# Patient Record
Sex: Female | Born: 1950 | Hispanic: No | Marital: Married | Smoking: Former smoker
Health system: Southern US, Community
[De-identification: ages and names within clinical notes are randomized; demographics above are authoritative.]

## PROBLEM LIST (undated history)

## (undated) DIAGNOSIS — E7800 Pure hypercholesterolemia, unspecified: Secondary | ICD-10-CM

## (undated) DIAGNOSIS — E079 Disorder of thyroid, unspecified: Secondary | ICD-10-CM

## (undated) DIAGNOSIS — I10 Essential (primary) hypertension: Secondary | ICD-10-CM

## (undated) HISTORY — PX: TONSILLECTOMY: SUR1361

## (undated) HISTORY — PX: THYROID SURGERY: SHX805

---

## 2012-02-25 ENCOUNTER — Emergency Department (HOSPITAL_COMMUNITY)
Admission: EM | Admit: 2012-02-25 | Discharge: 2012-02-25 | Disposition: A | Discharge status: Home / Self Care | Payer: BC Managed Care – PPO | Attending: Emergency Medicine | Admitting: Emergency Medicine

## 2012-02-25 ENCOUNTER — Encounter (HOSPITAL_COMMUNITY): Payer: Self-pay | Admitting: *Deleted

## 2012-02-25 ENCOUNTER — Emergency Department (HOSPITAL_COMMUNITY): Payer: BC Managed Care – PPO

## 2012-02-25 DIAGNOSIS — H9319 Tinnitus, unspecified ear: Secondary | ICD-10-CM | POA: Insufficient documentation

## 2012-02-25 DIAGNOSIS — Z79899 Other long term (current) drug therapy: Secondary | ICD-10-CM | POA: Insufficient documentation

## 2012-02-25 DIAGNOSIS — I10 Essential (primary) hypertension: Secondary | ICD-10-CM | POA: Insufficient documentation

## 2012-02-25 DIAGNOSIS — M25559 Pain in unspecified hip: Secondary | ICD-10-CM | POA: Insufficient documentation

## 2012-02-25 DIAGNOSIS — M25519 Pain in unspecified shoulder: Secondary | ICD-10-CM | POA: Insufficient documentation

## 2012-02-25 DIAGNOSIS — M542 Cervicalgia: Secondary | ICD-10-CM | POA: Insufficient documentation

## 2012-02-25 DIAGNOSIS — R51 Headache: Secondary | ICD-10-CM | POA: Insufficient documentation

## 2012-02-25 DIAGNOSIS — Y92410 Unspecified street and highway as the place of occurrence of the external cause: Secondary | ICD-10-CM | POA: Insufficient documentation

## 2012-02-25 HISTORY — DX: Essential (primary) hypertension: I10

## 2012-02-25 MED ORDER — CYCLOBENZAPRINE HCL 10 MG PO TABS: 10.0000 mg | ORAL_TABLET | Freq: Two times a day (BID) | ORAL | Status: AC | PRN

## 2012-02-25 MED ORDER — OXYCODONE-ACETAMINOPHEN 5-325 MG PO TABS: 1.0000 | ORAL_TABLET | ORAL | Status: AC | PRN
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
## 2012-02-26 NOTE — ED Provider Notes (Signed)
Medical screening examination/treatment/procedure(s) were performed by non-physician practitioner and as supervising physician I was immediately available for consultation/collaboration.  Denine Brotz T Alsace Dowd, MD 02/26/12 2328 

## 2013-03-07 ENCOUNTER — Emergency Department (HOSPITAL_COMMUNITY): Payer: BC Managed Care – PPO

## 2013-03-07 ENCOUNTER — Encounter (HOSPITAL_COMMUNITY): Payer: Self-pay | Admitting: Emergency Medicine

## 2013-03-07 ENCOUNTER — Emergency Department (HOSPITAL_COMMUNITY)
Admission: EM | Admit: 2013-03-07 | Discharge: 2013-03-07 | Disposition: A | Discharge status: Home / Self Care | Payer: BC Managed Care – PPO | Attending: Emergency Medicine | Admitting: Emergency Medicine

## 2013-03-07 DIAGNOSIS — I10 Essential (primary) hypertension: Secondary | ICD-10-CM | POA: Insufficient documentation

## 2013-03-07 DIAGNOSIS — R0789 Other chest pain: Secondary | ICD-10-CM

## 2013-03-07 DIAGNOSIS — R0602 Shortness of breath: Secondary | ICD-10-CM | POA: Insufficient documentation

## 2013-03-07 DIAGNOSIS — E7800 Pure hypercholesterolemia, unspecified: Secondary | ICD-10-CM | POA: Insufficient documentation

## 2013-03-07 DIAGNOSIS — E079 Disorder of thyroid, unspecified: Secondary | ICD-10-CM | POA: Insufficient documentation

## 2013-03-07 DIAGNOSIS — Z79899 Other long term (current) drug therapy: Secondary | ICD-10-CM | POA: Insufficient documentation

## 2013-03-07 DIAGNOSIS — Z87891 Personal history of nicotine dependence: Secondary | ICD-10-CM | POA: Insufficient documentation

## 2013-03-07 DIAGNOSIS — R071 Chest pain on breathing: Secondary | ICD-10-CM | POA: Insufficient documentation

## 2013-03-07 HISTORY — DX: Disorder of thyroid, unspecified: E07.9

## 2013-03-07 HISTORY — DX: Pure hypercholesterolemia, unspecified: E78.00

## 2013-03-07 LAB — CBC
Hemoglobin: 13 g/dL (ref 12.0–15.0)
MCH: 30.6 pg (ref 26.0–34.0)
Platelets: 288 10*3/uL (ref 150–400)
RBC: 4.25 MIL/uL (ref 3.87–5.11)

## 2013-03-07 LAB — POCT I-STAT TROPONIN I: Troponin i, poc: 0 ng/mL (ref 0.00–0.08)

## 2013-03-07 LAB — BASIC METABOLIC PANEL
CO2: 29 mEq/L (ref 19–32)
Calcium: 10 mg/dL (ref 8.4–10.5)
GFR calc non Af Amer: >90 mL/min (ref >90)
Glucose, Bld: 77 mg/dL (ref 70–99)
Potassium: 3.9 mEq/L (ref 3.5–5.1)
Sodium: 142 mEq/L (ref 135–145)

## 2013-03-07 LAB — D-DIMER, QUANTITATIVE: D-Dimer, Quant: 0.48 ug/mL-FEU (ref 0.00–0.48)

## 2013-03-07 MED ORDER — METOPROLOL TARTRATE 25 MG PO TABS
100.0000 mg | ORAL_TABLET | Freq: Once | ORAL | Status: AC
  Administered 2013-03-07: 100 mg via ORAL
  Filled 2013-03-07: qty 4

## 2013-03-07 MED ORDER — ASPIRIN 325 MG PO TABS
325.0000 mg | ORAL_TABLET | Freq: Once | ORAL | Status: AC
  Administered 2013-03-07: 325 mg via ORAL
  Filled 2013-03-07: qty 1

## 2013-03-07 MED ORDER — KETOROLAC TROMETHAMINE 30 MG/ML IJ SOLN
30.0000 mg | Freq: Once | INTRAMUSCULAR | Status: AC
  Administered 2013-03-07: 30 mg via INTRAVENOUS
  Filled 2013-03-07: qty 1

## 2013-03-07 MED ORDER — METOPROLOL TARTRATE 1 MG/ML IV SOLN
INTRAVENOUS | Status: AC
  Filled 2013-03-07: qty 15

## 2013-03-07 MED ORDER — LABETALOL HCL 5 MG/ML IV SOLN
5.0000 mg | Freq: Once | INTRAVENOUS | Status: AC
  Administered 2013-03-07: 5 mg via INTRAVENOUS
  Filled 2013-03-07: qty 4

## 2013-03-07 MED ORDER — IOHEXOL 350 MG/ML SOLN
80.0000 mL | Freq: Once | INTRAVENOUS | Status: AC | PRN
  Administered 2013-03-07: 80 mL via INTRAVENOUS

## 2013-03-07 NOTE — ED Notes (Signed)
Patient going to CT #3 with RN and transportation.

## 2013-03-07 NOTE — ED Provider Notes (Addendum)
62 year old female, history of hypertension, hypercholesterolemia who presents with sharp left-sided chest pain, worse with deep breathing, pinpointed to the left inframammary area. Non-reproducible on palpation, no swelling of the legs, no other risk factors for heart disease or risk factors for pulmonary embolus. No history of stress test.  EKG unremarkable  On my exam clear heart and lung sounds, no murmurs rubs or gallops, no abnormal lung sounds, no distress. Vital signs normal, pulse 70, blood pressure 135/85.  Will  Perform CT coronary to rule out obstruction though less likely.   I have seen and interpreted the EKG and agree with the resident interpretation.   I saw and evaluated the patient, reviewed the resident's note and I agree with the findings and plan.   Vida Roller, MD 03/07/13 1036  Vida Roller, MD 03/07/13 [phone]

## 2013-03-07 NOTE — ED Notes (Signed)
Patient returned to room from CT. 

## 2013-03-07 NOTE — ED Provider Notes (Signed)
History     CSN: [id number]  Arrival date & time 03/07/13  9147   First MD Initiated Contact with Patient 03/07/13 [phone]      Chief Complaint  Patient presents with  . Chest Pain    left    (Consider location/radiation/quality/duration/timing/severity/associated sxs/prior treatment) Patient is a 62 y.o. female presenting with chest pain. The history is provided by the patient and medical records.  Chest Pain Pain location:  L chest Pain quality: sharp and stabbing   Pain radiates to:  Does not radiate Pain radiates to the back: no   Pain severity:  Mild Onset quality:  Sudden Duration:  4 hours Timing:  Constant Progression:  Unchanged Chronicity:  New Context: breathing   Relieved by: pushing on the exact spot on the left chest. Worsened by:  Deep breathing Associated symptoms: shortness of breath   Associated symptoms: no abdominal pain, no cough, no diaphoresis, no fever, no palpitations and not vomiting   Risk factors: high cholesterol and hypertension   Risk factors: no diabetes mellitus, no immobilization, not pregnant, no prior DVT/PE, no smoking and no surgery     Past Medical History  Diagnosis Date  . Hypertension   . Hypercholesterolemia   . Thyroid disease     Past Surgical History  Procedure Laterality Date  . Thyroid surgery      No family history on file.  History  Substance Use Topics  . Smoking status: Former Games developer  . Smokeless tobacco: Not on file  . Alcohol Use: No    OB History   Grav Para Term Preterm Abortions TAB SAB Ect Mult Living                  Review of Systems  Constitutional: Negative for fever, chills, diaphoresis, activity change and appetite change.  HENT: Negative for neck pain and neck stiffness.   Respiratory: Positive for shortness of breath. Negative for cough, chest tightness and wheezing.   Cardiovascular: Positive for chest pain. Negative for palpitations.  Gastrointestinal: Negative for vomiting, abdominal  pain, diarrhea and constipation.  Genitourinary: Negative for dysuria, decreased urine volume and difficulty urinating.  Skin: Negative for rash and wound.  Neurological: Negative for syncope and light-headedness.  Psychiatric/Behavioral: Negative for confusion and agitation.  All other systems reviewed and are negative.    Allergies  Peanut-containing drug products  Home Medications   Current Outpatient Rx  Name  Route  Sig  Dispense  Refill  . ALPRAZolam (XANAX) 0.25 MG tablet   Oral   Take 0.25 mg by mouth 2 (two) times daily as needed. For axienty         . cloNIDine (CATAPRES) 0.1 MG tablet   Oral   Take 0.1 mg by mouth 2 (two) times daily.         Marland Kitchen levothyroxine (SYNTHROID, LEVOTHROID) 88 MCG tablet   Oral   Take 88 mcg by mouth daily.         . rosuvastatin (CRESTOR) 5 MG tablet   Oral   Take 5 mg by mouth daily.           BP 144/92  Pulse 85  Resp 16  SpO2 98%  Physical Exam  Nursing note and vitals reviewed. Constitutional: She is oriented to person, place, and time. She appears well-developed and well-nourished.  HENT:  Head: Normocephalic and atraumatic.  Right Ear: External ear normal.  Left Ear: External ear normal.  Nose: Nose normal.  Mouth/Throat: Oropharynx is clear and  moist. No oropharyngeal exudate.  Eyes: Conjunctivae are normal. Pupils are equal, round, and reactive to light.  Neck: Normal range of motion. Neck supple.  Cardiovascular: Normal rate, regular rhythm, normal heart sounds and intact distal pulses.  Exam reveals no gallop and no friction rub.   No murmur heard. Pulmonary/Chest: Effort normal and breath sounds normal. No respiratory distress. She has no wheezes. She has no rales. She exhibits no tenderness.  Abdominal: Soft. Bowel sounds are normal. She exhibits no distension and no mass. There is no tenderness. There is no rebound and no guarding.  Musculoskeletal: Normal range of motion. She exhibits no edema and no  tenderness.  Neurological: She is alert and oriented to person, place, and time.  Skin: Skin is warm and dry.  Psychiatric: She has a normal mood and affect. Her behavior is normal. Judgment and thought content normal.    ED Course  Procedures (including critical care time)  Labs Reviewed  CBC  BASIC METABOLIC PANEL  D-DIMER, QUANTITATIVE  POCT I-STAT TROPONIN I  POCT I-STAT TROPONIN I   Dg Chest 2 View  03/07/2013  *RADIOLOGY REPORT*  Clinical Data: Chest pain  CHEST - 2 VIEW  Comparison: 02/25/2012  Findings: Cardiomediastinal silhouette is stable.  Mild hyperinflation again noted.  No acute infiltrate or pulmonary edema.  Stable probable chronic mild interstitial prominence.  IMPRESSION: No active disease.  Mild hyperinflation.  No significant change.   Original Report Authenticated By: Natasha Mead, M.D.    Ct Heart Morp W/cta Cor W/score W/ca W/cm &/or Wo/cm  03/07/2013  *RADIOLOGY REPORT*  INDICATION: Chest pain  CT ANGIOGRAPHY OF THE HEART, CORONARY ARTERY, STRUCTURE, AND MORPHOLOGY  COMPARISON:  None  CONTRAST: 80mL OMNIPAQUE IOHEXOL 350 MG/ML SOLN  TECHNIQUE:  CT angiography of the coronary vessels was performed on a 256 channel system using prospective ECG gating.  A scout and ECG- gated noncontrast exam (for calcium scoring) were performed. Appropriate delay was determined by bolus tracking after injection of iodinated contrast, and an ECG-gated coronary CTA was performed with sub-mm slice collimation during late diastole.  Imaging post processing was performed on an independent workstation creating multiplanar and 3-D images, allowing for quantitative analysis of the heart and coronary arteries.  Note that this exam targets the heart and the chest was not imaged in its entirety.  PREMEDICATION: Lopressor  100 mg, P.O.  Nitroglycerin  400 mcg, sublingual.  5 mg IV labatelol  FINDINGS: Technical quality: good Heart rate:  57.  CORONARY ARTERIES: Left Main:  Normal sized vessel with no   luminal stenosis. There is a calcific lesion just superior to the origin of the left main coronary artery.  LAD:  Normal sized vessel with no atherosclerotic disease or luminal stenosis. Diagonals:  Single diagonal without atherosclerotic disease. LCx:  Normal sized vessel with no atherosclerotic disease or luminal stenosis. OMs:  A single obtuse marginal without atherosclerotic disease. RCA:   Normal sized vessel with no atherosclerotic disease or luminal stenosis. PDA:        Normal sized vessel with no atherosclerotic disease or luminal stenosis. Dominance: right  CORONARY CALCIUM: Total Agatston Score:         76.3 MESA database percentile:     90th  AORTA AND PULMONARY MEASUREMENTS:  OTHER FINDINGS: Mild atelectasis at the lung bases.  Mild calcifications along the root of the aorta.  No pericardial fluid.  Limited view upper is unremarkable.  Review of the skeleton is unremarkable.  IMPRESSION:  1.  No  evidence of luminal stenosis within the coronary arteries. 2.  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 3.  Total calcium score equal 76 which is 90% for the age-matched database.  Of note entirety of measured calcification is at the ostium of the left main artery. Recommend outpatient cardiology consultation.  Report was called to Dr. Preston Fleeting at 15 45 on 03/07/2013.   Original Report Authenticated By: Genevive Bi, M.D.      Date: 03/07/2013  Rate: 82 bpm  Rhythm: normal sinus rhythm  QRS Axis: left  Intervals: normal  ST/T Wave abnormalities: nonspecific ST changes  Conduction Disutrbances:none  Narrative Interpretation: Normal sinus rhythm without evidence of acute ischemia  Old EKG Reviewed: none available    1. Acute chest wall pain       MDM  62 yo F presents for several hrs of left-sided pleuritic chest pain without radiation or associated symptoms. No recent illnesses. Afebrile, vital signs stable. Pain improved with palpation of spot on left chest. EKG  without evidence of acute ischemia. Low risk Wells Criteria; PERC positive (age). D-dimer negative; doubt PE in this low-risk patient. Serial troponins negative. Cardiac CT obtained due to pt's family history and no history of provocative testing. Imaging reveals no obstructive disease but calcium score is 90th percentile for age. Will have pt follow-up with Cardiology. Patient given return precautions, including worsening of signs or symptoms. Patient instructed to follow-up with Cardiology regarding plaque.         Clemetine Marker, MD 03/07/13 [phone]

## 2013-03-07 NOTE — ED Notes (Signed)
Lab at bedside

## 2013-03-07 NOTE — ED Notes (Signed)
Per EMS, patient started having L rib pain that worsened with inhalation, movement and palpation.   No radiation. Denies trauma/injury.  Denies cough.  18 L hand PTA by EMS.

## 2013-03-08 NOTE — ED Provider Notes (Signed)
I saw and evaluated the patient, reviewed the resident's note and I agree with the findings and plan.  I have seen and interpreted the EKG and I agree with the resident's interpretation.  Please see my separate note for details regarding my interaction with the patient.  Vida Roller, MD 03/08/13 [phone]

## 2013-04-05 ENCOUNTER — Ambulatory Visit (INDEPENDENT_AMBULATORY_CARE_PROVIDER_SITE_OTHER): Payer: BC Managed Care – PPO | Admitting: Internal Medicine

## 2013-04-05 ENCOUNTER — Encounter (HOSPITAL_COMMUNITY): Payer: Self-pay | Admitting: Internal Medicine

## 2013-04-05 ENCOUNTER — Encounter: Payer: Self-pay | Admitting: Internal Medicine

## 2013-04-05 VITALS — BP 140/96 | HR 77 | Ht 66.0 in | Wt 139.8 lb

## 2013-04-05 DIAGNOSIS — R079 Chest pain, unspecified: Secondary | ICD-10-CM | POA: Insufficient documentation

## 2013-04-05 DIAGNOSIS — I10 Essential (primary) hypertension: Secondary | ICD-10-CM | POA: Insufficient documentation

## 2013-04-05 DIAGNOSIS — R9389 Abnormal findings on diagnostic imaging of other specified body structures: Secondary | ICD-10-CM

## 2013-04-05 DIAGNOSIS — R931 Abnormal findings on diagnostic imaging of heart and coronary circulation: Secondary | ICD-10-CM | POA: Insufficient documentation

## 2013-04-05 DIAGNOSIS — E785 Hyperlipidemia, unspecified: Secondary | ICD-10-CM

## 2013-04-05 NOTE — Progress Notes (Signed)
OFFICE NOTE  Chief Complaint:  Chest pain  Primary Care Physician: Windy Carina, PA-C  HPI:  Jade Davidson is a pleasant 62 year old female originally from [place], with advanced degrees, and a history of dyslipidemia, hypertension, 30-pack-year smoking history, and family history of coronary disease. Currently works as a Psychiatrist.  Recently she was at school and was sitting at her desk, when she developed acute onset sharp, stabbing left chest pain which was worse with inspiration. This lasted for several minutes and was so intense that EMS was called and she was transported to the emergency room. Apparently she was administered nitroglycerin by EMS and had resolution of her pain, but it was unclear whether this is related to medication or time. She underwent testing in the emergency room including a CT angiogram. This demonstrated nonobstructive coronary disease, but there was a chunk of calcium at the left main ostium/possibly the coronary cusp. Her calcium score was 73, which is the 90th percentile given her age. She's had no further chest pain symptoms. She was referred for evaluation of her abnormal cardiac CT and chest pain episode.  PMHx:  Past Medical History  Diagnosis Date  . Hypertension   . Hypercholesterolemia   . Thyroid disease     Past Surgical History  Procedure Laterality Date  . Thyroid surgery    . Tonsillectomy  age 45    FAMHx:  Family History  Problem Relation Age of Onset  . Heart disease Mother   . Stroke Father   . Hypertension Father   . Kidney disease Mother   . Stroke Paternal Grandmother     SOCHx:   reports that she quit smoking about 12 months ago. She does not have any smokeless tobacco history on file. She reports that she does not drink alcohol or use illicit drugs.  ALLERGIES:  Allergies  Allergen Reactions  . Peanut-Containing Drug Products     Swelling, itching    ROS: A comprehensive review of systems was negative  except for: Cardiovascular: positive for chest pain  HOME MEDS: Current Outpatient Prescriptions  Medication Sig Dispense Refill  . ALPRAZolam (XANAX) 0.25 MG tablet Take 0.5 mg by mouth 2 (two) times daily as needed for sleep. For axienty      . levothyroxine (SYNTHROID, LEVOTHROID) 75 MCG tablet Take 75 mcg by mouth daily before breakfast.      . metoprolol succinate (TOPROL-XL) 25 MG 24 hr tablet Take 25 mg by mouth daily.      . pravastatin (PRAVACHOL) 20 MG tablet Take 20 mg by mouth daily.       No current facility-administered medications for this visit.    LABS/IMAGING: No results found for this or any previous visit (from the past 48 hour(s)). No results found.  VITALS: BP 140/96  Pulse 77  Ht 5\' 6"  (1.676 m)  Wt 139 lb 12.8 oz (63.413 kg)  BMI 22.58 kg/m2  EXAM: General appearance: alert and no distress Neck: no adenopathy, no carotid bruit, no JVD, supple, symmetrical, trachea midline and thyroid not enlarged, symmetric, no tenderness/mass/nodules Lungs: clear to auscultation bilaterally Heart: regular rate and rhythm, S1, S2 normal, no murmur, click, rub or gallop Abdomen: soft, non-tender; bowel sounds normal; no masses,  no organomegaly Extremities: extremities normal, atraumatic, no cyanosis or edema Pulses: 2+ and symmetric Skin: Skin color, texture, turgor normal. No rashes or lesions Neurologic: Grossly normal  EKG: Normal sinus rhythm at 77, possible left atrial enlargement, incomplete right bundle pattern, poor R-wave progression  ASSESSMENT: 1. Chest pain, with typical and atypical features 2. Abnormal cardiac CT with a low calcium score, however calcium is possibly located at the ostium of the left main or perhaps the left coronary cusp 3. Hypertension 4. Dyslipidemia 5. Former 30-pack-year smoker 6. Family history of coronary disease  PLAN: 1.   Jade Davidson had an episode of intense chest pain, but did not have any evidence of myocardial damage.  Her CT demonstrated calcium at/or around the left main coronary ostium. It is difficult to tell from the images whether this is in the vessel, in the adventitia, or in the coronary sinus. Overall, her calcium score is low, but for her age presents greater risk.  Certainly, she has multiple risk factors for coronary disease. What remains to be seen as whether there is hemodynamically significant stenosis of the left main.  She certainly does not describe symptoms such as worsening fatigue, shortness of breath with exertion, or chest pain which is predictable, to suggest that. I would recommend exercise nuclear stress testing to determine the physiologic significance of her coronary calcium.  That is negative, would recommend continued aggressive medical therapy and lifestyle modification to reduce her risk of further coronary disease.  Thank you for referring her for consult.  Chrystie Nose, MD, [facility] Attending Cardiologist [facility]  Rotha Cassels C 04/05/2013, 6:03 PM

## 2013-04-05 NOTE — Patient Instructions (Addendum)
Your physician has requested that you have en exercise stress myoview. For further information please visit [email or web address] Please follow instruction sheet, as given. Please Schedule test after June 16th  Your physician recommends that you schedule a follow-up appointment in: after stress test

## 2013-05-03 ENCOUNTER — Ambulatory Visit (HOSPITAL_COMMUNITY)
Admission: RE | Admit: 2013-05-03 | Discharge: 2013-05-03 | Disposition: A | Discharge status: Home / Self Care | Payer: BC Managed Care – PPO | Source: Ambulatory Visit | Attending: Cardiovascular Disease | Admitting: Cardiovascular Disease

## 2013-05-03 DIAGNOSIS — R079 Chest pain, unspecified: Secondary | ICD-10-CM | POA: Insufficient documentation

## 2013-05-03 DIAGNOSIS — R5381 Other malaise: Secondary | ICD-10-CM | POA: Insufficient documentation

## 2013-05-03 DIAGNOSIS — R0609 Other forms of dyspnea: Secondary | ICD-10-CM | POA: Insufficient documentation

## 2013-05-03 DIAGNOSIS — R0989 Other specified symptoms and signs involving the circulatory and respiratory systems: Secondary | ICD-10-CM | POA: Insufficient documentation

## 2013-05-03 DIAGNOSIS — R002 Palpitations: Secondary | ICD-10-CM | POA: Insufficient documentation

## 2013-05-03 DIAGNOSIS — R931 Abnormal findings on diagnostic imaging of heart and coronary circulation: Secondary | ICD-10-CM

## 2013-05-03 DIAGNOSIS — R42 Dizziness and giddiness: Secondary | ICD-10-CM | POA: Insufficient documentation

## 2013-05-03 MED ORDER — TECHNETIUM TC 99M SESTAMIBI GENERIC - CARDIOLITE
10.9000 | Freq: Once | INTRAVENOUS | Status: AC | PRN
  Administered 2013-05-03: 10.9 via INTRAVENOUS

## 2013-05-03 MED ORDER — TECHNETIUM TC 99M SESTAMIBI GENERIC - CARDIOLITE
31.7000 | Freq: Once | INTRAVENOUS | Status: AC | PRN
  Administered 2013-05-03: 31.7 via INTRAVENOUS

## 2013-05-03 NOTE — Procedures (Addendum)
[facility] [address] [place] [address] [place] [address] [phone]  Cardiology Nuclear Med Study  Jade Davidson is a 62 y.o. female     MRN : [id number]     DOB: January 07, 1951  Procedure Date: 05/03/2013  Nuclear Med Background Indication for Stress Test:  Evaluation for Ischemia and Abnormal EKG History:  cad Cardiac Risk Factors: Family History - CAD, History of Smoking, Hypertension and Lipids  Symptoms:  Chest Pain, Dizziness, DOE, Fatigue, Light-Headedness and Palpitations   Nuclear Pre-Procedure Caffeine/Decaff Intake:  7:00pm NPO After: 5:00am   IV Site: R Forearm  IV 0.9% NS with Angio Cath:  22g  Chest Size (in):  N/A IV Started by: Emmit Pomfret, RN  Height: 5\' 6"  (1.676 m)  Cup Size: B  BMI:  Body mass index is 22.45 kg/(m^2). Weight:  139 lb (63.05 kg)   Tech Comments:  N/A    Nuclear Med Study 1 or 2 day study: 1 day  Stress Test Type:  Stress  Order Authorizing Provider:  Zoila Shutter, MD   Resting Radionuclide: Technetium 3m Sestamibi  Resting Radionuclide Dose: 10.9 mCi   Stress Radionuclide:  Technetium 29m Sestamibi  Stress Radionuclide Dose: 31.7 mCi           Stress Protocol Rest HR: 81 Stress HR: 137  Rest BP: 145/106 Stress BP: 180/103  Exercise Time (min): 6:30 METS: 7.0   Predicted Max HR: 161 bpm % Max HR: 85.09 bpm Rate Pressure Product: 21308  Dose of Adenosine (mg):  n/a Dose of Lexiscan: n/a mg  Dose of Atropine (mg): n/a Dose of Dobutamine: n/a mcg/kg/min (at max HR)  Stress Test Technologist: Esperanza Sheets, CCT Nuclear Technologist: Gonzella Lex, CNMT   Rest Procedure:  Myocardial perfusion imaging was performed at rest 45 minutes following the intravenous administration of Technetium 77m Sestamibi. Stress Procedure:  The patient performed treadmill exercise using a Bruce  Protocol for 6:30 minutes. The patient stopped due to SOB and fatigue and denied any chest pain.  There were no  significant ST-T wave changes.  Technetium 13m Sestamibi was injected at peak exercise and myocardial perfusion imaging was performed after a brief delay.  Transient Ischemic Dilatation (Normal <1.22):  0.83 Lung/Heart Ratio (Normal <0.45):  0.25 QGS EDV:  59 ml QGS ESV:  15 ml LV Ejection Fraction: 75%  Signed by       Rest ECG: NSR - Normal EKG  Stress ECG: No significant change from baseline ECG  QPS Raw Data Images:  Normal; no motion artifact; normal heart/lung ratio. Stress Images:  Normal homogeneous uptake in all areas of the myocardium. Rest Images:  Normal homogeneous uptake in all areas of the myocardium. Subtraction (SDS):  No evidence of ischemia.  Impression Exercise Capacity:  Good exercise capacity. BP Response:  Normal blood pressure response. Clinical Symptoms:  No significant symptoms noted. ECG Impression:  No significant ST segment change suggestive of ischemia. Comparison with Prior Nuclear Study: No images to compare  Overall Impression:  Normal stress nuclear study.  LV Wall Motion:  NL LV Function; NL Wall Motion   Runell Gess, MD  05/03/2013 5:59 PM

## 2013-05-06 ENCOUNTER — Telehealth: Payer: Self-pay | Admitting: *Deleted

## 2013-05-06 NOTE — Telephone Encounter (Signed)
Message copied by Chauncey Reading on Mon May 06, 2013  8:11 AM ------      Message from: Chrystie Nose      Created: Sat May 04, 2013  7:55 AM       Please notify patient that the test results were normal.            -Dr. Rennis Golden ------

## 2013-05-06 NOTE — Telephone Encounter (Signed)
Result letter drafted and mailed to pt.  

## 2013-05-07 ENCOUNTER — Encounter (HOSPITAL_COMMUNITY): Payer: BC Managed Care – PPO

## 2013-05-21 ENCOUNTER — Ambulatory Visit: Payer: BC Managed Care – PPO | Admitting: Internal Medicine

## 2013-09-03 ENCOUNTER — Other Ambulatory Visit: Payer: Self-pay | Admitting: Family Medicine

## 2013-09-03 ENCOUNTER — Ambulatory Visit
Admission: RE | Admit: 2013-09-03 | Discharge: 2013-09-03 | Disposition: A | Discharge status: Home / Self Care | Payer: Worker's Compensation | Source: Ambulatory Visit | Attending: Family Medicine | Admitting: Family Medicine

## 2013-09-03 DIAGNOSIS — R609 Edema, unspecified: Secondary | ICD-10-CM

## 2017-06-22 ENCOUNTER — Other Ambulatory Visit (HOSPITAL_COMMUNITY): Payer: Self-pay | Admitting: Oculoplastics Ophthalmology

## 2017-06-22 DIAGNOSIS — E0500 Thyrotoxicosis with diffuse goiter without thyrotoxic crisis or storm: Secondary | ICD-10-CM

## 2017-06-28 ENCOUNTER — Ambulatory Visit (HOSPITAL_COMMUNITY): Payer: BC Managed Care – PPO

## 2017-06-28 ENCOUNTER — Ambulatory Visit (HOSPITAL_COMMUNITY)
Admission: RE | Admit: 2017-06-28 | Discharge: 2017-06-28 | Disposition: A | Discharge status: Home / Self Care | Payer: BC Managed Care – PPO | Source: Ambulatory Visit | Attending: Oculoplastics Ophthalmology | Admitting: Oculoplastics Ophthalmology

## 2017-06-28 DIAGNOSIS — H0520 Unspecified exophthalmos: Secondary | ICD-10-CM | POA: Diagnosis not present

## 2017-06-28 DIAGNOSIS — E0500 Thyrotoxicosis with diffuse goiter without thyrotoxic crisis or storm: Secondary | ICD-10-CM | POA: Insufficient documentation

## 2018-06-07 IMAGING — CT CT ORBITS W/O CM
3 series · 14 of 47 positions shown, 16 images · non-contrast
Comparison: None or for

CLINICAL DATA: Proptosis.  Possible thyroid disorder.

EXAM:
CT ORBITS WITHOUT CONTRAST
TECHNIQUE: Multidetector CT images were obtained using the standard protocol
without intravenous contrast.

[Series 3: facialbone 2.0 st · axial · 0.37mm/px · z∈[-130,-30]mm · 8 of 60 slices shown, 10 images]
[im 5/60  brain]
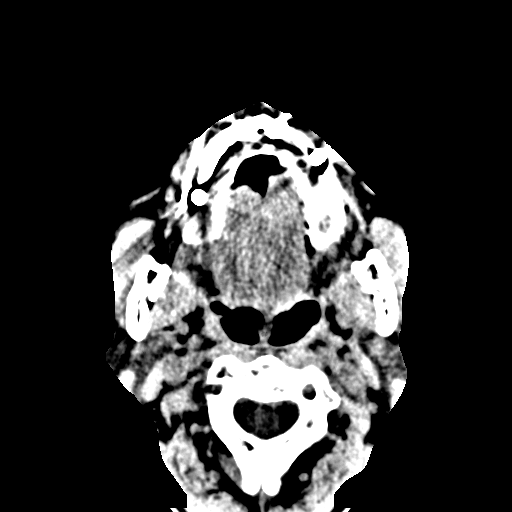
[im 5/60  bone]
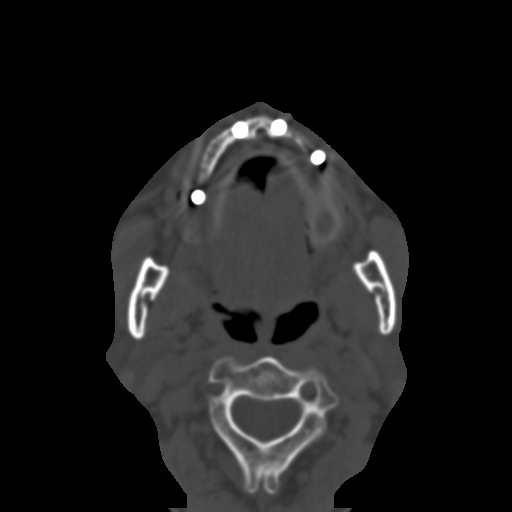
[im 13/60  bone]
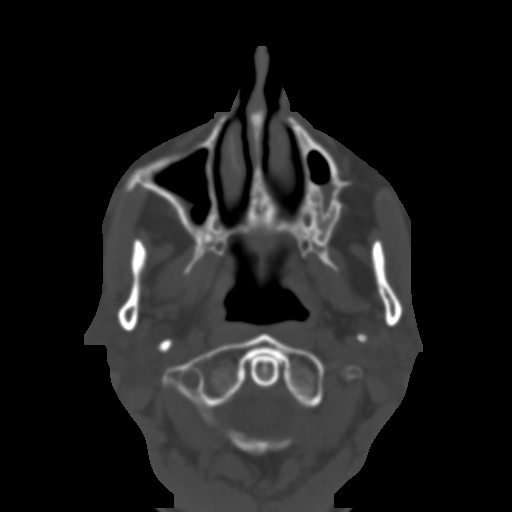
[im 19/60  bone]
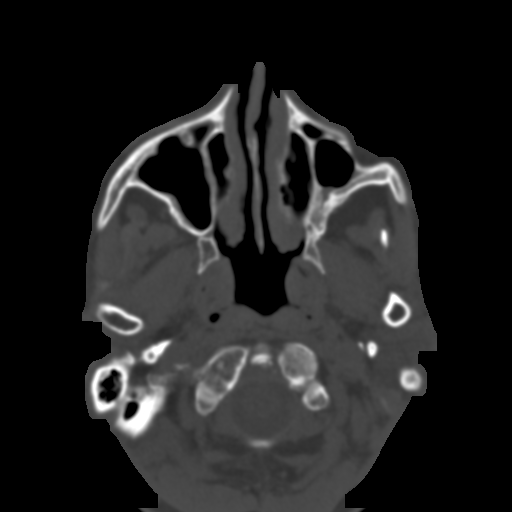
[im 27/60  bone]
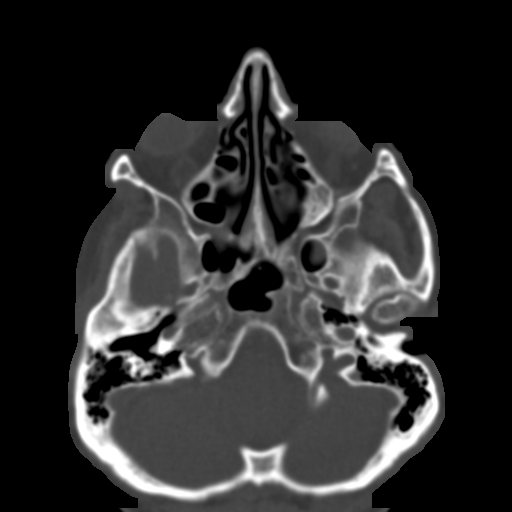
[im 33/60  brain]
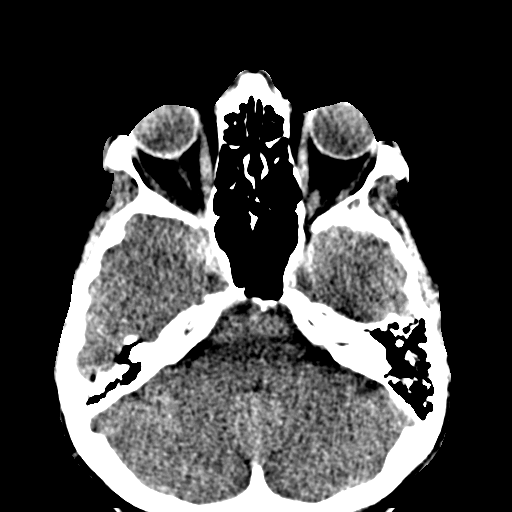
[im 33/60  bone]
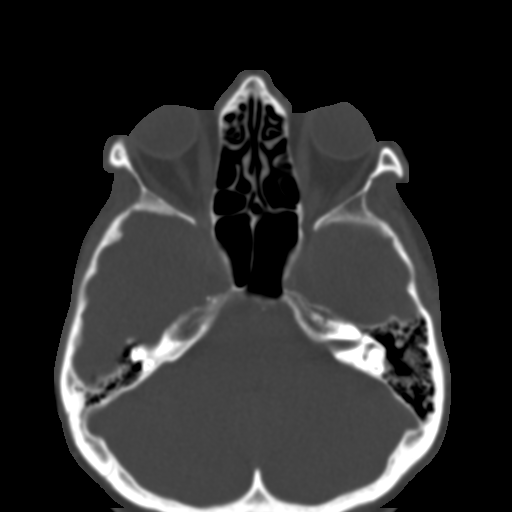
[im 41/60  bone]
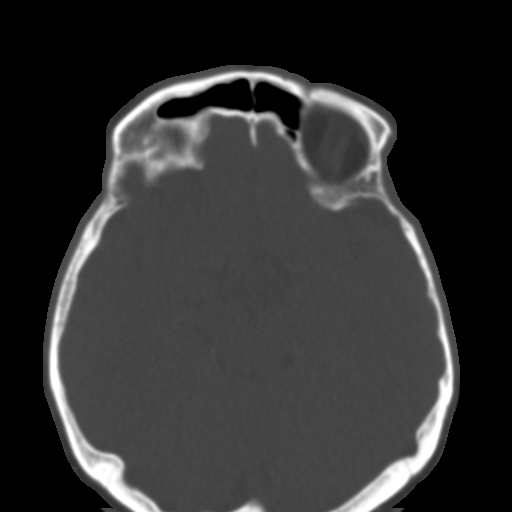
[im 47/60  bone]
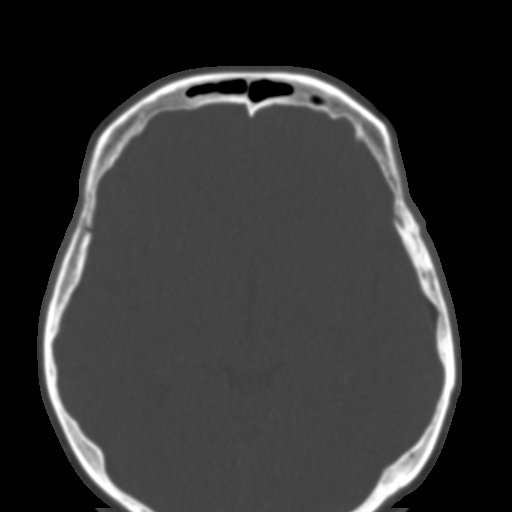
[im 55/60  bone]
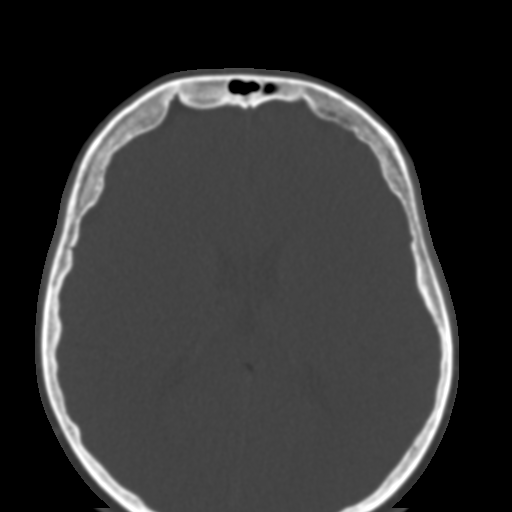

[Series 7: facialbone 2.0 cor st · coronal · 0.23mm/px · 3 of 91 slices shown]
[im 31/91  bone]
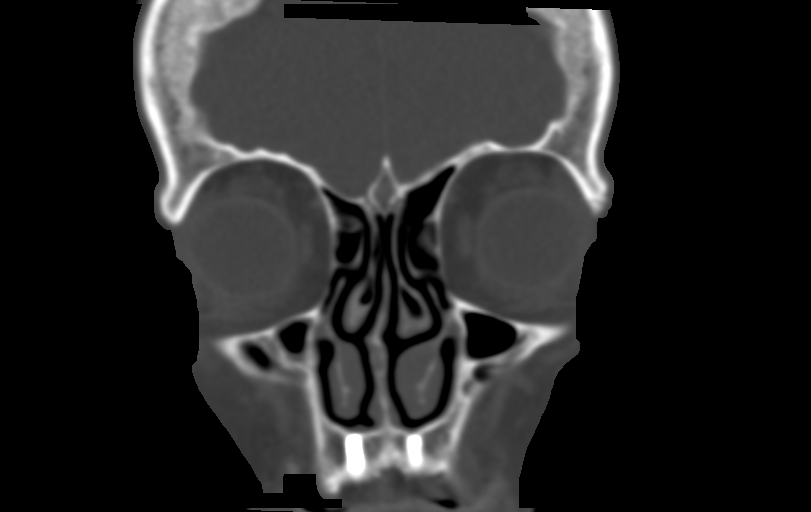
[im 41/91  bone]
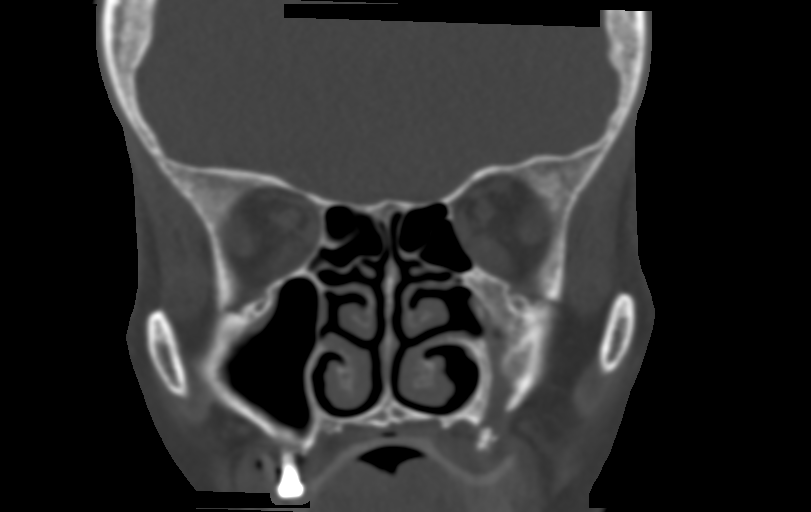
[im 51/91  bone]
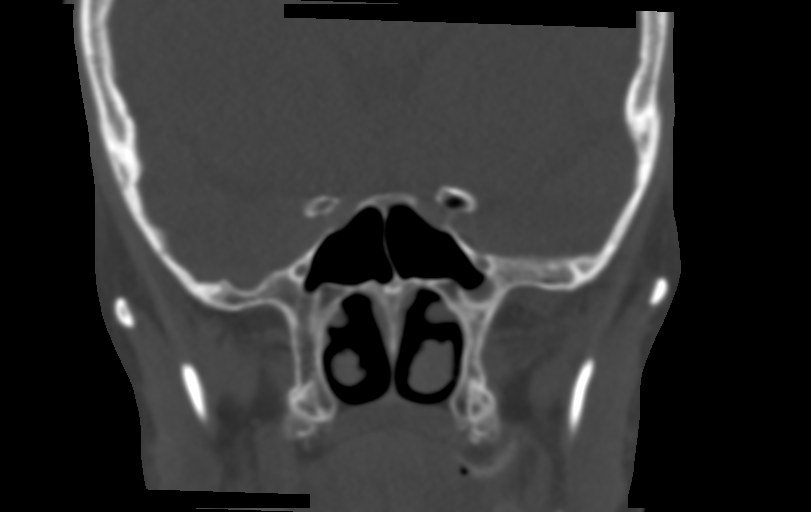

[Series 8: facialbone 2.0 sag st · sagittal · 0.23mm/px · 3 of 94 slices shown]
[im 32/94  bone]
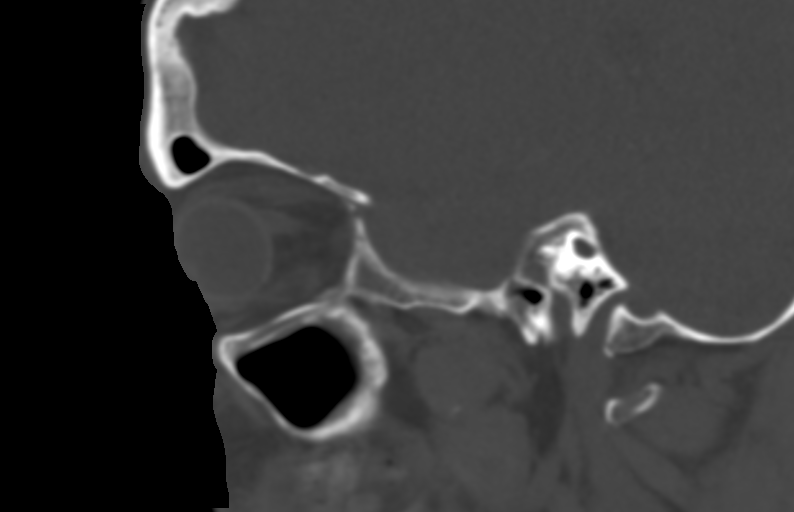
[im 47/94  bone]
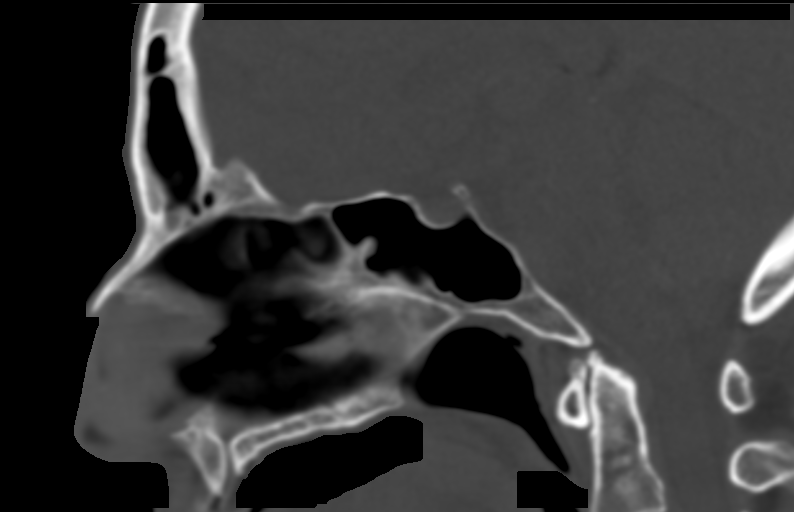
[im 63/94  bone]
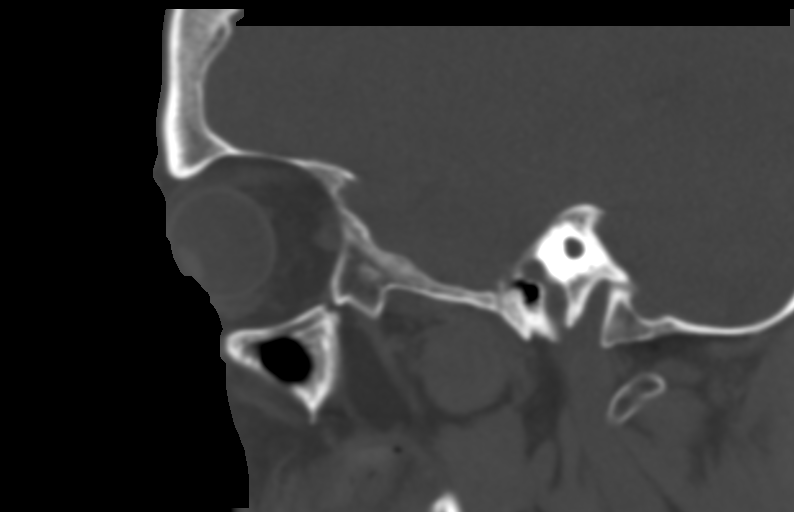

[14 of 47 positions shown; findings below may reference images not displayed]

FINDINGS: Orbits: No traumatic or inflammatory finding. Globes, optic nerves,
vascular structures, and lacrimal glands are normal. BILATERAL
proptosis is seen. There is mild symmetric increase in BILATERAL
orbital fat, without stranding, as well as mild enlargement of the
BILATERAL medial and inferior recti, all consistent with thyroid
ophthalmopathy.

Visualized sinuses: Chronic sinus disease most notable in the
maxillary regions. Thickening of the bone with decreased volume of
the LEFT maxillary sinus. Some slight layering fluid on the LEFT.

Soft tissues: Negative.

Limited intracranial: No abnormality.
IMPRESSION: BILATERAL proptosis appears related to increased orbital fat and
enlarged inferior and medial recti consistent with the diagnosis of
thyroid ophthalmopathy. No orbital mass or inflammatory process.

## 2018-09-18 ENCOUNTER — Telehealth: Payer: Self-pay

## 2018-09-18 NOTE — Telephone Encounter (Signed)
SENT REFERRAL TO SCHEDULING AND FILED NOTES 

## 2018-12-12 ENCOUNTER — Encounter: Payer: Self-pay | Admitting: Internal Medicine

## 2018-12-12 ENCOUNTER — Encounter (INDEPENDENT_AMBULATORY_CARE_PROVIDER_SITE_OTHER): Payer: Self-pay

## 2018-12-12 ENCOUNTER — Ambulatory Visit: Payer: BC Managed Care – PPO | Admitting: Internal Medicine

## 2018-12-12 VITALS — BP 119/80 | HR 75 | Ht 66.0 in | Wt 144.0 lb

## 2018-12-12 DIAGNOSIS — R0789 Other chest pain: Secondary | ICD-10-CM

## 2018-12-12 DIAGNOSIS — I10 Essential (primary) hypertension: Secondary | ICD-10-CM | POA: Diagnosis not present

## 2018-12-12 DIAGNOSIS — E785 Hyperlipidemia, unspecified: Secondary | ICD-10-CM | POA: Diagnosis not present

## 2018-12-12 MED ORDER — ROSUVASTATIN CALCIUM 20 MG PO TABS: 20.0000 mg | ORAL_TABLET | Freq: Every day | ORAL | 3 refills | Status: AC

## 2018-12-12 NOTE — Patient Instructions (Signed)
Medication Instructions:  Your physician has recommended you make the following change in your medication:   START Crestor 20mg  daily. An Rx has been sent to your pharmacy  If you need a refill on your cardiac medications before your next appointment, please call your pharmacy.   Lab work: Your physician recommends that you return for a FASTING lipid profile in 3 months  If you have labs (blood work) drawn today and your tests are completely normal, you will receive your results only by: Marland Kitchen MyChart Message (if you have MyChart) OR . A paper copy in the mail If you have any lab test that is abnormal or we need to change your treatment, we will call you to review the results.  Testing/Procedures: None ordered  Follow-Up: At [facility], you and your health needs are our priority.  As part of our continuing mission to provide you with exceptional heart care, we have created designated Provider Care Teams.  These Care Teams include your primary Cardiologist (physician) and Advanced Practice Providers (APPs -  Physician Assistants and Nurse Practitioners) who all work together to provide you with the care you need, when you need it. You will need a follow up appointment in 3 months.  You may see No primary care provider on file. Dr. Rennis Golden or one of the following Advanced Practice Providers on your designated Care Team: Azalee Course, [place] . Micah Flesher, PA-C

## 2018-12-15 ENCOUNTER — Encounter: Payer: Self-pay | Admitting: Internal Medicine

## 2018-12-15 NOTE — Progress Notes (Signed)
OFFICE CONSULT NOTE  Chief Complaint:  Chest pain  Primary Care Physician: Verlon Au, MD  HPI:  Jade Davidson is a 68 y.o. female who is being seen today for the evaluation of chest pain at the request of Verlon Au, MD. This is a 68 year old [place] female who fled the floor to the [place].  She has a history of hypertension, dyslipidemia and thyroid disease.  Heart disease in her family, particularly in her mother.  Recently she has been having episodes of nocturnal angina.  She says she awakens sometimes at night with chest pain.  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  She was also evaluated for chest pain at [facility] and an echocardiogram and stress test were ordered however she said that the entire experience was very uncomfortable and she did not follow-up with the testing.  In addition lab work was reviewed recently which shows a very high LDL 172, total cholesterol 263, HDL 70 and triglycerides of 105.  She does have a history of anxiety as well and has been referred to behavioral health.  She uses Xanax as needed.  The chest pain is not noted with exertion or relieved by rest.  She describes it as sharp and quick in intensity.  She did have CT coronary angiography back in May 2014 which demonstrated no evidence of luminal stenosis, eccentric plaque at the origin of the left main coronary at a total calcium score of 76 which was 90% for age and sex matched control.  PMHx:  Past Medical History:  Diagnosis Date  . Hypercholesterolemia   . Hypertension   . Thyroid disease     Past Surgical History:  Procedure Laterality Date  . THYROID SURGERY    . TONSILLECTOMY  age 41    FAMHx:  Family History  Problem Relation Age of Onset  . Heart disease Mother   . Kidney disease Mother   . Stroke Father   . Hypertension Father   . Heart attack Maternal  Grandfather   . Stroke Paternal Grandmother     SOCHx:   reports that she quit smoking about 6 years ago. She has never used smokeless tobacco. She reports that she does not drink alcohol or use drugs.  ALLERGIES:  Allergies  Allergen Reactions  . Peanut-Containing Drug Products     Swelling, itching    ROS: Pertinent items noted in HPI and remainder of comprehensive ROS otherwise negative.  HOME MEDS: Current Outpatient Medications on File Prior to Visit  Medication Sig Dispense Refill  . ALPRAZolam (XANAX XR) 1 MG 24 hr tablet Take 1 tablet by mouth 2 (two) times daily.    Marland Kitchen escitalopram (LEXAPRO) 20 MG tablet Take 1 tablet by mouth daily.    Marland Kitchen levothyroxine (SYNTHROID, LEVOTHROID) 75 MCG tablet Take 75 mcg by mouth daily before breakfast.    . metoprolol succinate (TOPROL-XL) 25 MG 24 hr tablet Take 25 mg by mouth daily.     No current facility-administered medications on file prior to visit.     LABS/IMAGING: No results found for this or any previous visit (from the past 48 hour(s)). No results found.  LIPID PANEL: No results found for: CHOL, TRIG, HDL, CHOLHDL, VLDL, LDLCALC, LDLDIRECT  WEIGHTS: Wt Readings from Last 3 Encounters:  12/12/18 144 lb (65.3 kg)  05/03/13 139 lb (63 kg)  04/05/13 139 lb 12.8 oz (63.4 kg)  VITALS: BP 119/80   Pulse 75   Ht 5\' 6"  (1.676 m)   Wt 144 lb (65.3 kg)   BMI 23.24 kg/m   EXAM: General appearance: alert and no distress Neck: no carotid bruit, no JVD and thyroid not enlarged, symmetric, no tenderness/mass/nodules Lungs: clear to auscultation bilaterally Heart: regular rate and rhythm Abdomen: soft, non-tender; bowel sounds normal; no masses,  no organomegaly Extremities: extremities normal, atraumatic, no cyanosis or edema Pulses: 2+ and symmetric Skin: Skin color, texture, turgor normal. No rashes or lesions Neurologic: Grossly normal Psych: Pleasant, mildly anxious  EKG: Normal sinus rhythm at 75, possible left  atrial enlargement, poor R wave progression- personally reviewed  ASSESSMENT: 1. Atypical chest pain 2. No obstructive coronary disease by CT coronary angiography in May 2014, calcium score 76 3. Anxiety possible PTSD 4. Dyslipidemia  PLAN: 1.   Ms. Hiemenz has been describing angina, mostly in the evenings and upon awakening.  While this can have some qualities concerning for unstable angina, could also be associated with response to PTSD or sleep disorder.  She denies any significant apneic symptoms, heavy snoring or daytime fatigue.  She did have some eccentric calcification at the proximal LAD which contributed to her low calcium score in 2014.  It could be that that plaque has progressed.  It would be reasonable to consider stress testing however after discussing it at length she said she was not interested at this time.  I would recommend treatment of her dyslipidemia, particularly with high potency statin therapy to lower LDL at least to less than 254.  Will order Crestor 20 mg daily.  Plan repeat lipid profile in 3 months.  If she has further chest pain symptoms or is interested in testing, she can contact our office.  Thanks again for the kind referral.  Chrystie Nose, MD, [facility]  [place]  [facility]  Medical Director of the Advanced Lipid Disorders &  Cardiovascular Risk Reduction Clinic Diplomate of the American Board of Clinical Lipidology Attending Cardiologist  Direct Dial: [phone]  Fax: [phone]  Website:  [email or web address]  12/15/2018, 10:01 AM

## 2021-04-28 ENCOUNTER — Ambulatory Visit (INDEPENDENT_AMBULATORY_CARE_PROVIDER_SITE_OTHER): Payer: Medicare PPO | Admitting: Psychologist

## 2021-04-28 DIAGNOSIS — F411 Generalized anxiety disorder: Secondary | ICD-10-CM | POA: Diagnosis not present

## 2021-05-24 ENCOUNTER — Ambulatory Visit: Payer: Medicare PPO | Admitting: Psychologist

## 2021-05-25 ENCOUNTER — Ambulatory Visit (INDEPENDENT_AMBULATORY_CARE_PROVIDER_SITE_OTHER): Payer: Medicare PPO | Admitting: Psychologist

## 2021-05-25 DIAGNOSIS — F411 Generalized anxiety disorder: Secondary | ICD-10-CM | POA: Diagnosis not present

## 2021-06-02 ENCOUNTER — Ambulatory Visit: Payer: Medicare PPO | Admitting: Psychologist
